# Patient Record
Sex: Female | Born: 1970 | Hispanic: Yes | Marital: Married | Smoking: Never smoker
Health system: Southern US, Community
[De-identification: ages and names within clinical notes are randomized; demographics above are authoritative.]

## PROBLEM LIST (undated history)

## (undated) DIAGNOSIS — O24419 Gestational diabetes mellitus in pregnancy, unspecified control: Secondary | ICD-10-CM

## (undated) DIAGNOSIS — Z789 Other specified health status: Secondary | ICD-10-CM

## (undated) HISTORY — PX: NO PAST SURGERIES: SHX2092

## (undated) HISTORY — DX: Gestational diabetes mellitus in pregnancy, unspecified control: O24.419

---

## 2015-07-23 LAB — CYTOLOGY - PAP: PAP SMEAR: ABNORMAL — AB

## 2016-06-13 LAB — OB RESULTS CONSOLE HEPATITIS B SURFACE ANTIGEN: Hepatitis B Surface Ag: NEGATIVE

## 2016-06-13 LAB — OB RESULTS CONSOLE ABO/RH: RH TYPE: POSITIVE

## 2016-06-13 LAB — OB RESULTS CONSOLE GC/CHLAMYDIA
CHLAMYDIA, DNA PROBE: NEGATIVE
Gonorrhea: NEGATIVE

## 2016-06-13 LAB — OB RESULTS CONSOLE PLATELET COUNT: Platelets: 212

## 2016-06-13 LAB — OB RESULTS CONSOLE HGB/HCT, BLOOD
HEMATOCRIT: 39
HEMOGLOBIN: 13.2

## 2016-06-13 LAB — OB RESULTS CONSOLE VARICELLA ZOSTER ANTIBODY, IGG: Varicella: IMMUNE

## 2016-06-13 LAB — SICKLE CELL SCREEN: SICKLE CELL SCREEN: NORMAL

## 2016-06-13 LAB — GLUCOSE TOLERANCE, 1 HOUR: Glucose, 1 Hour GTT: 185

## 2016-06-13 LAB — OB RESULTS CONSOLE RPR: RPR: NONREACTIVE

## 2016-06-13 LAB — OB RESULTS CONSOLE ANTIBODY SCREEN: Antibody Screen: NEGATIVE

## 2016-06-13 LAB — OB RESULTS CONSOLE HIV ANTIBODY (ROUTINE TESTING): HIV: NONREACTIVE

## 2016-06-13 LAB — OB RESULTS CONSOLE RUBELLA ANTIBODY, IGM: Rubella: IMMUNE

## 2016-06-16 ENCOUNTER — Other Ambulatory Visit (HOSPITAL_COMMUNITY): Payer: Self-pay | Admitting: Obstetrics and Gynecology

## 2016-06-16 ENCOUNTER — Encounter (HOSPITAL_COMMUNITY): Payer: Self-pay

## 2016-06-16 DIAGNOSIS — Z3682 Encounter for antenatal screening for nuchal translucency: Secondary | ICD-10-CM

## 2016-06-17 LAB — GLUCOSE TOLERANCE, 3 HOURS
Glucose, GTT - 1 Hour: 239 — AB (ref ?–200)
Glucose, GTT - Fasting: 116 mg/dL — AB (ref 80–110)

## 2016-06-21 ENCOUNTER — Ambulatory Visit (HOSPITAL_COMMUNITY): Payer: Self-pay

## 2016-06-21 ENCOUNTER — Ambulatory Visit (HOSPITAL_COMMUNITY)
Admission: RE | Admit: 2016-06-21 | Discharge: 2016-06-21 | Disposition: A | Discharge status: Home / Self Care | Payer: Self-pay | Source: Ambulatory Visit | Attending: Obstetrics and Gynecology | Admitting: Obstetrics and Gynecology

## 2016-07-01 ENCOUNTER — Encounter: Payer: Self-pay | Admitting: *Deleted

## 2016-07-19 ENCOUNTER — Encounter (HOSPITAL_COMMUNITY): Payer: Self-pay

## 2016-07-19 ENCOUNTER — Ambulatory Visit (HOSPITAL_COMMUNITY)
Admission: RE | Admit: 2016-07-19 | Discharge: 2016-07-19 | Disposition: A | Discharge status: Home / Self Care | Payer: Self-pay | Source: Ambulatory Visit | Attending: Obstetrics and Gynecology | Admitting: Obstetrics and Gynecology

## 2016-07-19 ENCOUNTER — Ambulatory Visit (HOSPITAL_COMMUNITY): Payer: Self-pay

## 2016-07-21 ENCOUNTER — Ambulatory Visit: Payer: Self-pay | Admitting: *Deleted

## 2016-07-21 DIAGNOSIS — O24410 Gestational diabetes mellitus in pregnancy, diet controlled: Secondary | ICD-10-CM

## 2016-07-21 NOTE — Progress Notes (Signed)
  Patient was seen on 07/21/2016 for Gestational Diabetes self-management .She is here with her husband and Spanish interpretor was used.  Patient states she had GDM with last pregnancy 8 years ago. She states she had a BG this pregnancy of 230 mg/dl when she was eating normally, which included several sweet beverages each day. She has stopped drinking those and the last test she states was within normal ranges. She does not have a meter yet. Diet history was taken. The following learning objectives were met by the patient :   States the definition of Gestational Diabetes  States why dietary management is important in controlling blood glucose  Describes the effects of carbohydrates on blood glucose levels  Demonstrates ability to create a balanced meal plan  Demonstrates carbohydrate counting   States when to check blood glucose levels  Demonstrates proper blood glucose monitoring techniques  States the effect of stress and exercise on blood glucose levels  States the importance of limiting caffeine and abstaining from alcohol and smoking  Plan:  Aim for 3 Carb Choices per meal (45 grams) +/- 1 either way  Aim for 1-2 Carbs per snack Begin reading food labels for Total Carbohydrate of foods Consider  increasing your activity level by walking or other activity daily as tolerated Begin checking BG before breakfast and 2 hours after first bite of breakfast, lunch and dinner as directed by MD  Bring Log Book to every medical appointment   Take medication if directed by MD  Blood glucose monitor given: [employer] Lot # [id number] Exp: 05/14/2018 Blood glucose reading: 89 mg/dl  Patient instructed to monitor glucose levels: FBS: 60 - 95 mg/dl 2 hour: <120 mg/dl  Patient received the following handouts:  Nutrition Diabetes and Pregnancy  Carbohydrate Counting List  Patient will be seen for follow-up as needed.

## 2016-07-22 ENCOUNTER — Ambulatory Visit (HOSPITAL_COMMUNITY)
Admission: RE | Admit: 2016-07-22 | Discharge: 2016-07-22 | Disposition: A | Discharge status: Home / Self Care | Payer: 59 | Source: Ambulatory Visit | Attending: Obstetrics and Gynecology | Admitting: Obstetrics and Gynecology

## 2016-07-22 ENCOUNTER — Encounter (HOSPITAL_COMMUNITY): Payer: Self-pay

## 2016-07-22 ENCOUNTER — Other Ambulatory Visit (HOSPITAL_COMMUNITY): Payer: Self-pay | Admitting: Maternal and Fetal Medicine

## 2016-07-22 ENCOUNTER — Ambulatory Visit (HOSPITAL_COMMUNITY): Payer: 59

## 2016-07-22 ENCOUNTER — Ambulatory Visit (HOSPITAL_COMMUNITY)
Admission: RE | Admit: 2016-07-22 | Discharge: 2016-07-22 | Disposition: A | Discharge status: Home / Self Care | Payer: 59 | Source: Ambulatory Visit | Attending: Maternal and Fetal Medicine | Admitting: Maternal and Fetal Medicine

## 2016-07-22 ENCOUNTER — Other Ambulatory Visit: Payer: Self-pay

## 2016-07-22 DIAGNOSIS — O24410 Gestational diabetes mellitus in pregnancy, diet controlled: Secondary | ICD-10-CM | POA: Insufficient documentation

## 2016-07-22 DIAGNOSIS — O24911 Unspecified diabetes mellitus in pregnancy, first trimester: Secondary | ICD-10-CM

## 2016-07-22 DIAGNOSIS — O09521 Supervision of elderly multigravida, first trimester: Secondary | ICD-10-CM

## 2016-07-22 DIAGNOSIS — Z3A15 15 weeks gestation of pregnancy: Secondary | ICD-10-CM | POA: Insufficient documentation

## 2016-07-22 DIAGNOSIS — O09522 Supervision of elderly multigravida, second trimester: Secondary | ICD-10-CM | POA: Diagnosis not present

## 2016-07-22 DIAGNOSIS — Z3A13 13 weeks gestation of pregnancy: Secondary | ICD-10-CM

## 2016-07-22 DIAGNOSIS — Z3687 Encounter for antenatal screening for uncertain dates: Secondary | ICD-10-CM

## 2016-07-22 HISTORY — DX: Other specified health status: Z78.9

## 2016-07-22 NOTE — Progress Notes (Signed)
Appointment Date: 07/22/16 DOB: Apr 18, 1970 Referring Provider: Caroll Rancher, MD Attending: Renella Cunas, MD  [name] was seen for genetic counseling because of a maternal age of 46 y.o..   Language resources in person interpreter provided Spanish/English translation.  In summary:  Discussed AMA and associated risk for fetal aneuploidy  Discussed options for screening - Panorama drawn today  First screen  Quad screen  NIPS  Ultrasound  Discussed diagnostic testing options - declined  [facility]  Amniocentesis  Reviewed family history concerns - none reported  Discussed carrier screening options - declined  CF  SMA  Hemoglobinopathies  She was counseled regarding maternal age and the association with risk for chromosome conditions due to nondisjunction with aging of the ova.   We reviewed chromosomes, nondisjunction, and the associated 1 in 22 risk for fetal aneuploidy related to a maternal age of 46 y.o. at [redacted]w[redacted]d gestation.  She was counseled that the risk for aneuploidy decreases as gestational age increases, accounting for those pregnancies which spontaneously abort.  We specifically discussed Down syndrome (trisomy 68), trisomies 60 and 76, and sex chromosome aneuploidies (47,XXX and 47,XXY) including the common features and prognoses of each.   We reviewed available screening options including First Screen, Quad screen, noninvasive prenatal screening (NIPS)/cell free DNA (cfDNA) screening, and detailed ultrasound.  She was counseled that screening tests are used to modify a patient's a priori risk for aneuploidy, typically based on age. This estimate provides a pregnancy specific risk assessment. We reviewed the benefits and limitations of each option. Specifically, we discussed the conditions for which each test screens, the detection rates, and false positive rates of each. She was also counseled regarding diagnostic testing via [facility] and amniocentesis. We  reviewed the approximate 1 in 382-505 risk for complications from amniocentesis, including spontaneous pregnancy loss. We discussed the possible results that the tests might provide including: positive, negative, unanticipated, and no result. Finally, she was counseled regarding the cost of each option and potential out of pocket expenses.   After consideration of all the options, she elected to proceed with NIPS.  Those results will be available in 8-10 days.    She also expressed interest in pursuing a first trimester ultrasound, which was performed today.  The report will be documented separately.    [name] was provided with written information regarding cystic fibrosis (CF), spinal muscular atrophy (SMA) and hemoglobinopathies including the carrier frequency, availability of carrier screening and prenatal diagnosis if indicated.  In addition, we discussed that CF and hemoglobinopathies are routinely screened for as part of the [place] newborn screening panel.  After further discussion, she declined screening for CF, SMA and hemoglobinopathies.  Both family histories were reviewed and found to be noncontributory for birth defects, intellectual disability, and known genetic conditions. Without further information regarding the provided family history, an accurate genetic risk cannot be calculated. Further genetic counseling is warranted if more information is obtained.  [name] denied exposure to environmental toxins or chemical agents. She denied the use of alcohol, tobacco or street drugs. She denied significant viral illnesses during the course of her pregnancy. Her medical and surgical histories were noncontributory.   I counseled [name] regarding the above risks and available options.  The approximate face-to-face time with the genetic counselor was 45 minutes.  Cam Hai, MS,  Certified Genetic Counselor

## 2016-07-25 ENCOUNTER — Ambulatory Visit (INDEPENDENT_AMBULATORY_CARE_PROVIDER_SITE_OTHER): Payer: 59 | Admitting: Obstetrics and Gynecology

## 2016-07-25 VITALS — BP 118/78 | HR 72 | Wt 181.5 lb

## 2016-07-25 DIAGNOSIS — O24419 Gestational diabetes mellitus in pregnancy, unspecified control: Secondary | ICD-10-CM | POA: Insufficient documentation

## 2016-07-25 DIAGNOSIS — O09522 Supervision of elderly multigravida, second trimester: Secondary | ICD-10-CM

## 2016-07-25 DIAGNOSIS — O0990 Supervision of high risk pregnancy, unspecified, unspecified trimester: Secondary | ICD-10-CM | POA: Insufficient documentation

## 2016-07-25 DIAGNOSIS — O0992 Supervision of high risk pregnancy, unspecified, second trimester: Secondary | ICD-10-CM

## 2016-07-25 DIAGNOSIS — O24410 Gestational diabetes mellitus in pregnancy, diet controlled: Secondary | ICD-10-CM

## 2016-07-25 NOTE — Progress Notes (Signed)
Subjective:  Anna Velez is a 46 y.o. [id number] at [redacted]w[redacted]d being seen today for ongoing prenatal care. She is transferred to the clinic from the [facility] d/t H/O GDM and failed early first trimester screening.  She is currently monitored for the following issues for this high-risk pregnancy and has Supervision of high risk pregnancy, antepartum; Advanced maternal age in multigravida, second trimester; and Gestational diabetes on her problem list.  Patient reports no complaints.  Contractions: Not present. Vag. Bleeding: None.  Movement: Present. Denies leaking of fluid.   The following portions of the patient's history were reviewed and updated as appropriate: allergies, current medications, past family history, past medical history, past social history, past surgical history and problem list. Problem list updated.  Objective:   Vitals:   07/25/16 1318  BP: 118/78  Pulse: 72  Weight: 181 lb 8 oz (82.3 kg)    Fetal Status: Fetal Heart Rate (bpm): 144   Movement: Present     General:  Alert, oriented and cooperative. Patient is in no acute distress.  Skin: Skin is warm and dry. No rash noted.   Cardiovascular: Normal heart rate noted  Respiratory: Normal respiratory effort, no problems with respiration noted  Abdomen: Soft, gravid, appropriate for gestational age. Pain/Pressure: Absent     Pelvic:  Cervical exam deferred        Extremities: Normal range of motion.  Edema: None  Mental Status: Normal mood and affect. Normal behavior. Normal judgment and thought content.   Urinalysis:      Assessment and Plan:  Pregnancy: [id number] at [redacted]w[redacted]d  1. Advanced maternal age in multigravida, second trimester Had genetic screening drawn last week - [place] MFM OB COMP + 89 WK; Future  2. Supervision of high risk pregnancy, antepartum Stable Continue with qdPNV  3. Diet controlled gestational diabetes mellitus (GDM) in second trimester BS in goal range for the most part, outliners related to diet  choices. Suspect pt is truly a Type 2 DM but will wait for A1C Pregnancy and DM reviewed with pt - TSH - Hemoglobin A1c Pt traveling to [place] for 2 weeks. DVT   reviewed with pt Preterm labor symptoms and general obstetric precautions including but not limited to vaginal bleeding, contractions, leaking of fluid and fetal movement were reviewed in detail with the patient. Please refer to After Visit Summary for other counseling recommendations.  No Follow-up on file.   Chancy Milroy, MD

## 2016-07-25 NOTE — Patient Instructions (Signed)
[place] compresin (How to Use Compression Stockings) [place] compresin son medias elsticas que aprietan las piernas. Ayudan a aumentar la circulacin de sangre a las piernas, reducen su hinchazn y [employer] probabilidad de que se formen cogulos de [employer]. Generalmente, las siguientes personas usan medias de compresin:  Las que se estn recuperando de [place].  Las que tienen mala circulacin en las piernas.  Las que son propensas a Best boy cogulos de [employer] piernas.  Las que tienen venas varicosas.  Las que [employer] sentadas o en cama durante mucho tiempo. CMO USAR LAS MEDIAS DE COMPRESIN Antes de colocarse las medias de compresin:  Verifique que sean de la talla correcta. Si no sabe cul es la talla, pregntele al mdico.  Asegrese de que estn limpias, secas y en buenas condiciones.  Revselas para ver si estn rasgadas o rotas. No se las ponga si estn rasgadas o rotas. Pngase las medias en cuanto se despierta por la maana, antes de levantarse. Tngalas puestas durante el tiempo que el mdico le aconseje. Cuando use las medias:  Mantngalas tan estiradas como sea posible. No permita que se arruguen. Es Therapist, occupational que las medias se arruguen alrededor de los dedos de los pies o detrs de las rodillas.  No se baje las medias ni las deje bajas. Esto puede reducir el flujo de sangre a las piernas.  Cmbieselas de inmediato si se mojan o se ensucian. Cuando se quite las medias, [employer] piernas y los pies. Cualquier cosa que no parezca normal puede requerir [employer]. Observe si tiene:  Llagas abiertas.  Puntos rojos.  Hinchazn. INFORMACIN Y CONSEJOS  No deje de [employer] de compresin sin hablar antes con el [place] medias con un detergente suave y agua fra o tibia. No use leja. Deje secar las medias al aire o squelas en una secadora de ropa a baja temperatura.  Reemplace las  medias cada 3 a 77meses.  Si le han indicado que use un humectante para la piel como parte de su plan de tratamiento, aplique la crema o la locin por la noche, de modo que la piel est seca cuando se coloque las medias por la [place]. Es ms difcil ponerse las medias con locin en las piernas o los pies. SOLICITE ATENCIN MDICA SI: [place] y solicite atencin mdica si:  Tiene sensacin de pinchazos en los pies o en las piernas.  Tiene algn cambio nuevo en la piel.  Tiene lesiones cutneas que empeoran.  Tiene hinchazn o dolor que empeoran. SOLICITE ATENCIN MDICA DE INMEDIATO SI:  Tiene entumecimiento u hormigueo en las piernas que no mejoran inmediatamente despus de [employer].  Los dedos de los pies o los pies estn fros y de [employer].  Le aparecen llagas abiertas o puntos rojos en las piernas que no desaparecen.  Observa o percibe un punto caliente en la pierna.  Tiene una hinchazn o un dolor nuevos en la pierna.  Siente dolor en el pecho o le falta el aire sin ningn motivo.  Tiene latidos cardacos rpidos o irregulares.  Se siente mareado o siente que va a desvanecerse. Esta informacin no tiene Marine scientist el consejo del mdico. Asegrese de hacerle al mdico cualquier pregunta que tenga. Document Released: 10/05/2011 Document Revised: 05/04/2015 Document Reviewed: 12/18/2013 Elsevier Interactive Patient Education  2018 [place] de diabetes mellitus gestacional (Gestational Diabetes Mellitus, Diagnosis) La diabetes gestacional (diabetes mellitus  gestacional) es una forma de diabetes a corto plazo (temporal) que puede Copy. Este cuadro desaparece despus del parto. Puede deberse a uno de [employer] o a ambos:  El cuerpo no produce la cantidad suficiente de una hormona llamada insulina.  El cuerpo no responde de forma normal a la insulina que produce. La insulina permite que los ciertos  azcares (glucosa) ingresen a las clulas del cuerpo. Esto le proporciona la energa. Cuando se tiene diabetes, la glucosa no puede ingresar a las clulas. Esto produce un aumento del nivel de glucosa en la sangre (hiperglucemia). Si la diabetes se trata, es posible que ni usted ni el beb se vean afectados. El mdico fijar los objetivos del tratamiento para usted. Generalmente, los resultados de la glucemia deben ser los siguientes:  Despus de no comer durante mucho tiempo (ayunar): 95mg /dl (5,16mmol/l).  Despus de las comidas (posprandial): ? Una hora despus de una comida: igual o menor que 140mg /dl (7,65mmol/l). ? Dos horas despus de una comida: igual o menor que 120mg /dl (6,63mmol/l).  Nivel de A1c (hemoglobinaA1c): del 6% al 6,5%. CUIDADOS EN EL HOGAR Preguntas para hacerle al mdico Puede hacer las siguientes preguntas:  Debo reunirme con Radio broadcast assistant para el cuidado de la diabetes?  Dnde puedo encontrar un grupo de apoyo para personas diabticas?  Qu equipos necesitar para cuidarme en casa?  Qu medicamentos para la diabetes necesito? Cundo debo tomarlos?  Con qu frecuencia debo controlarme el nivel de glucosa en la sangre?  A qu nmero puedo llamar si tengo preguntas?  Cundo es la prxima cita con el mdico? Instrucciones generales  [employer] medicamentos de venta libre y los recetados solamente como se lo haya indicado el mdico.  Mantenga un peso saludable durante el [place].  Concurra a todas las visitas de control como se lo haya indicado el mdico. Esto es importante. SOLICITE AYUDA SI:  Su nivel de glucosa en la sangre es igual o superior a 240mg /dl (13,28mmol/dl).  Su nivel de glucosa en la sangre es igual o superior a 200mg /dl (11,41mmol/l), y tiene cetonas en la orina.  Ha estado enferma o ha tenido fiebre durante 2o ms das y no [place].  Si tiene alguno de estos problemas durante ms de 6horas: ? No puede comer ni  beber. ? Siente malestar estomacal (nuseas). ? Vomita. ? La materia fecal es lquida (diarrea). SOLICITE AYUDA DE INMEDIATO SI:  El nivel de glucosa en la sangre est por debajo de 54mg /dl (17mmol/l).  Est confundida.  Tiene dificultad para hacer lo siguiente: ? Pensar con claridad. ? Respirar.  El beb se mueve menos de lo normal.  Tiene los siguientes sntomas: ? Niveles moderados o altos de cetonas en la orina. ? Hemorragia vaginal. ? Secrecin de un lquido fuera de lo comn de la vagina. ? Contracciones prematuras. Estas pueden causar una sensacin de opresin en el vientre. Esta informacin no tiene Marine scientist el consejo del mdico. Asegrese de hacerle al mdico cualquier pregunta que tenga. Document Released: 05/04/2015 Document Revised: 05/04/2015 Document Reviewed: 02/13/2015 Elsevier Interactive Patient Education  [employer].

## 2016-07-26 LAB — TSH: TSH: 1.38 u[IU]/mL (ref 0.450–4.500)

## 2016-07-26 LAB — HEMOGLOBIN A1C
Est. average glucose Bld gHb Est-mCnc: 126 mg/dL
Hgb A1c MFr Bld: 6 % — ABNORMAL HIGH (ref 4.8–5.6)

## 2016-07-28 ENCOUNTER — Other Ambulatory Visit: Payer: Self-pay

## 2016-07-29 ENCOUNTER — Telehealth (HOSPITAL_COMMUNITY): Payer: Self-pay | Admitting: MS"

## 2016-07-29 NOTE — Telephone Encounter (Signed)
Called Dennard Nip to discuss her prenatal cell free DNA test results with [facility] interpreter, Eda.  Mrs. Anna Velez had Panorama testing through [facility].  Testing was offered because of maternal age.   The patient was identified by name and DOB.  We reviewed that these are within normal limits, showing a less than 1 in 10,000 risk for trisomies 21, 18 and 13, and monosomy X (Turner syndrome).  In addition, the risk for triploidy and sex chromosome trisomies (47,XXX and 47,XXY) was also low risk. We reviewed that this testing identifies > 99% of pregnancies with trisomy 80, trisomy 62, sex chromosome trisomies (47,XXX and 47,XXY), and triploidy. The detection rate for trisomy 18 is 96%.  The detection rate for monosomy X is ~92%.  The false positive rate is <0.1% for all conditions. Testing was also consistent with female fetal sex.  The patient did wish to know fetal sex.  She understands that this testing does not identify all genetic conditions.  All questions were answered to her satisfaction, she was encouraged to call with additional questions or concerns.  Chipper Oman, MS Certified Genetic Counselor 07/29/2016 1:37 PM

## 2016-07-29 NOTE — Telephone Encounter (Signed)
Attempted to contact the patient regarding results of noninvasive prenatal screening (NIPS)/prenatal cell free DNA testing, which are within normal limits. Called patient and spoke with patient's husband through telephonic interpreter [phone]. Husband stated that he is at work until 1:00 pm today with the cell phone and will ask patient to return call once he returns home this afternoon.   Santiago Glad Lannie Heaps 07/29/2016 10:05 AM

## 2016-08-08 ENCOUNTER — Encounter: Payer: Self-pay | Admitting: General Practice

## 2016-08-08 DIAGNOSIS — O0990 Supervision of high risk pregnancy, unspecified, unspecified trimester: Secondary | ICD-10-CM

## 2016-08-15 ENCOUNTER — Ambulatory Visit (HOSPITAL_COMMUNITY): Admission: RE | Admit: 2016-08-15 | Payer: 59 | Source: Ambulatory Visit

## 2016-08-24 ENCOUNTER — Telehealth: Payer: Self-pay | Admitting: Family Medicine

## 2016-08-24 ENCOUNTER — Encounter: Payer: Self-pay | Admitting: Family Medicine

## 2016-08-24 ENCOUNTER — Encounter: Payer: 59 | Admitting: Obstetrics and Gynecology

## 2016-09-05 ENCOUNTER — Ambulatory Visit: Payer: 59 | Admitting: Dietician

## 2016-09-08 ENCOUNTER — Encounter: Payer: 59 | Attending: Obstetrics and Gynecology | Admitting: Dietician

## 2016-09-08 ENCOUNTER — Encounter: Payer: Self-pay | Admitting: Dietician

## 2016-09-08 DIAGNOSIS — O24410 Gestational diabetes mellitus in pregnancy, diet controlled: Secondary | ICD-10-CM | POA: Diagnosis not present

## 2016-09-08 DIAGNOSIS — Z713 Dietary counseling and surveillance: Secondary | ICD-10-CM | POA: Insufficient documentation

## 2016-09-08 NOTE — Progress Notes (Signed)
Diabetes Self-Management Education  Visit Type: First/Initial  Appt. Start Time: 0830 (patient arrived late) Appt. End Time: 1000  09/08/2016  [name], identified by name and date of birth, is a 46 y.o. female with a diagnosis of Diabetes: Gestational Diabetes.  There are concerns that she had preexisting diabetes or prediabetes.  Her A1C was 6% 07/25/16.  She had Gestational diabetes with her last pregnancy.  Patient lives with her husband, 68 yo and 65 yo.  She has a 60 yo child in [place].  She does the shopping and cooking.    She has a blood sugar meter but did not bring it and does not recall the name.   A new meter provided for demonstration: [employer]  Lot:  [id number], Expiration 08/23/17  They did not want an interpretor.  Appropriate forms signed.  She speaks a small amount of [place].  Her husband is proficient in [place].  He provided interpretation.  ASSESSMENT  Height 5' 2.99" (1.6 m), weight 181 lb (82.1 kg), last menstrual period 04/17/2016. Body mass index is 32.07 kg/m.      Diabetes Self-Management Education - 09/08/16 0852      Visit Information   Visit Type First/Initial     Initial Visit   Diabetes Type Gestational Diabetes   Are you currently following a meal plan? No   Are you taking your medications as prescribed? Not on Medications   Date Diagnosed 05-2016     Health Coping   How would you rate your overall health? Excellent     Psychosocial Assessment   Self-care barriers English as a second language   Self-management support Doctor's office;Family   Other persons present Patient;Spouse/SO   Patient Concerns Nutrition/Meal planning;Glycemic Control   Special Needs Other (comment)  interpretor and Spanish materials   Preferred Learning Style No preference indicated   Learning Readiness Ready   How often do you need to have someone help you when you read instructions, pamphlets, or other written materials from your doctor or  pharmacy? 1 - Never  When in spanish   What is the last grade level you completed in school? 10th grade in [place]     Pre-Education Assessment   Patient understands the diabetes disease and treatment process. Needs Instruction   Patient understands incorporating nutritional management into lifestyle. Needs Instruction   Patient undertands incorporating physical activity into lifestyle. Needs Instruction   Patient understands monitoring blood glucose, interpreting and using results Needs Instruction   Patient understands prevention, detection, and treatment of acute complications. Needs Instruction   Patient understands prevention, detection, and treatment of chronic complications. Needs Instruction   Patient understands how to develop strategies to address psychosocial issues. Needs Instruction   Patient understands how to develop strategies to promote health/change behavior. Needs Instruction     Complications   Last HgB A1C per patient/outside source 6 %  07/25/16   How often do you check your blood sugar? 3-4 times/day   Fasting Blood glucose range (mg/dL) 70-129   Postprandial Blood glucose range (mg/dL) 70-129     Dietary Intake   Breakfast eggs, [employer] toast, cheese, juice  8 am   Snack (morning) none   Lunch salad, 2 tortillas, beans, chicken OR McDonald's grilled chicken salad  1 pm   Snack (afternoon) cereal (unsweetened) and milk OR cucumber   Dinner Oatmeal and eggs   7 pm   Snack (evening) none   Beverage(s) water, juice     Exercise  Exercise Type Light (walking / raking leaves)   How many days per week to you exercise? 0   How many minutes per day do you exercise? 0   Total minutes per week of exercise 0     Patient Education   Previous Diabetes Education No   Disease state  Other (comment)  GDM   Nutrition management  Role of diet in the treatment of diabetes and the relationship between the three main macronutrients and blood glucose level;Food label reading,  portion sizes and measuring food.;Carbohydrate counting;Meal options for control of blood glucose level and chronic complications.   Physical activity and exercise  Role of exercise on diabetes management, blood pressure control and cardiac health.   Monitoring Taught/evaluated SMBG meter.;Purpose and frequency of SMBG.;Identified appropriate SMBG and/or A1C goals.   Acute complications Taught treatment of hypoglycemia - the 15 rule.   Psychosocial adjustment Role of stress on diabetes;Identified and addressed patients feelings and concerns about diabetes   Preconception care Pregnancy and GDM  Role of pre-pregnancy blood glucose control on the development of the fetus     Individualized Goals (developed by patient)   Nutrition Follow meal plan discussed   Physical Activity Exercise 5-7 days per week;30 minutes per day   Medications Not Applicable   Monitoring  test my blood glucose as discussed   Health Coping discuss diabetes with (comment)  MD/RD     Post-Education Assessment   Patient understands the diabetes disease and treatment process. Demonstrates understanding / competency   Patient understands incorporating nutritional management into lifestyle. Demonstrates understanding / competency   Patient undertands incorporating physical activity into lifestyle. Demonstrates understanding / competency   Patient understands monitoring blood glucose, interpreting and using results Demonstrates understanding / competency   Patient understands prevention, detection, and treatment of acute complications. Demonstrates understanding / competency   Patient understands prevention, detection, and treatment of chronic complications. Demonstrates understanding / competency   Patient understands how to develop strategies to address psychosocial issues. Demonstrates understanding / competency   Patient understands how to develop strategies to promote health/change behavior. Demonstrates understanding /  competency     Outcomes   Expected Outcomes Demonstrated interest in learning. Expect positive outcomes   Future DMSE PRN   Program Status Completed      Individualized Plan for Diabetes Self-Management Training:   Learning Objective:  Patient will have a greater understanding of diabetes self-management. Patient education plan is to attend individual and/or group sessions per assessed needs and concerns.   Plan:   Patient Instructions  Continue to check your blood sugar 4 times per day.  Before breakfast  2 hours after breakfast  2 hours after lunch  2 hours after dinner Record your readings and bring them to your doctor. Avoid juice and added sugar. Continue to bake or grill rather than fry. Continue to eat increased non starchy vegetables. Have protein and carbohydrate with each meal and snack.   Expected Outcomes:  Demonstrated interest in learning. Expect positive outcomes  Education material provided: Meal plan card, Planning Helathy meals, and Nutrition, Diabetes, and Pregnancy all in Spanish.  If problems or questions, patient to contact team via:  Phone  Future DSME appointment: PRN

## 2016-09-08 NOTE — Patient Instructions (Signed)
Continue to check your blood sugar 4 times per day.  Before breakfast  2 hours after breakfast  2 hours after lunch  2 hours after dinner Record your readings and bring them to your doctor. Avoid juice and added sugar. Continue to bake or grill rather than fry. Continue to eat increased non starchy vegetables. Have protein and carbohydrate with each meal and snack.

## 2016-09-20 ENCOUNTER — Ambulatory Visit (INDEPENDENT_AMBULATORY_CARE_PROVIDER_SITE_OTHER): Payer: 59 | Admitting: Obstetrics and Gynecology

## 2016-09-20 ENCOUNTER — Other Ambulatory Visit: Payer: Self-pay

## 2016-09-20 VITALS — BP 113/62 | HR 67 | Wt 183.0 lb

## 2016-09-20 DIAGNOSIS — O09522 Supervision of elderly multigravida, second trimester: Secondary | ICD-10-CM

## 2016-09-20 DIAGNOSIS — Z789 Other specified health status: Secondary | ICD-10-CM

## 2016-09-20 DIAGNOSIS — Z603 Acculturation difficulty: Secondary | ICD-10-CM | POA: Insufficient documentation

## 2016-09-20 DIAGNOSIS — Z713 Dietary counseling and surveillance: Secondary | ICD-10-CM | POA: Diagnosis not present

## 2016-09-20 DIAGNOSIS — O0990 Supervision of high risk pregnancy, unspecified, unspecified trimester: Secondary | ICD-10-CM

## 2016-09-20 DIAGNOSIS — O24419 Gestational diabetes mellitus in pregnancy, unspecified control: Secondary | ICD-10-CM

## 2016-09-20 MED ORDER — ASPIRIN EC 81 MG PO TBEC: 81.0000 mg | DELAYED_RELEASE_TABLET | Freq: Every day | ORAL | 10 refills | Status: DC

## 2016-09-20 NOTE — Progress Notes (Signed)
Prenatal Visit Note Date: 09/20/2016 Clinic: [facility]  Subjective:  Anna Velez is a 46 y.o. [id number] at [redacted]w[redacted]d being seen today for ongoing prenatal care.  She is currently monitored for the following issues for this high-risk pregnancy and has Supervision of high risk pregnancy, antepartum; Advanced maternal age in multigravida, second trimester; Gestational diabetes (early diagnosis); and Language barrier on her problem list.  Patient reports no complaints.   Contractions: Not present. Vag. Bleeding: None.  Movement: Present. Denies leaking of fluid.   The following portions of the patient's history were reviewed and updated as appropriate: allergies, current medications, past family history, past medical history, past social history, past surgical history and problem list. Problem list updated.  Objective:   Vitals:   09/20/16 1426  BP: 113/62  Pulse: 67  Weight: 183 lb (83 kg)    Fetal Status: Fetal Heart Rate (bpm): 156 Fundal Height: 23 cm Movement: Present     General:  Alert, oriented and cooperative. Patient is in no acute distress.  Skin: Skin is warm and dry. No rash noted.   Cardiovascular: Normal heart rate noted  Respiratory: Normal respiratory effort, no problems with respiration noted  Abdomen: Soft, gravid, appropriate for gestational age. Pain/Pressure: Absent     Pelvic:  Cervical exam deferred        Extremities: Normal range of motion.  Edema: None  Mental Status: Normal mood and affect. Normal behavior. Normal judgment and thought content.   Urinalysis:      Assessment and Plan:  Pregnancy: [id number] at [redacted]w[redacted]d  1. Gestational diabetes mellitus (GDM) in second trimester, gestational diabetes method of control unspecified Normal log book today. Pt to be set up for fetal echo given AMA and early GDM diagnosis. Recommend start low dose ASA today. Baseline pre-x labs - Comprehensive metabolic panel - HgB Q9I - Protein / creatinine  ratio, urine - EKG 12-Lead - Ambulatory referral to Pediatric Cardiology  2. Language barrier Interpreter used  3. Supervision of high risk pregnancy, antepartum Pt had u/s in [place] and brought disc and report (in spanish). Given high risk pregnancy, I told her that I recommend MFM u/s but to bring report and disc with her tomorrow to anatomy u/s. Baseline EKG today - Comprehensive metabolic panel - HgB Y6E - Protein / creatinine ratio, urine - EKG 12-Lead - Ambulatory referral to Pediatric Cardiology  4. Advanced maternal age in multigravida, second trimester Low risk panorama. Seen by [facility] already  Preterm labor symptoms and general obstetric precautions including but not limited to vaginal bleeding, contractions, leaking of fluid and fetal movement were reviewed in detail with the patient. Please refer to After Visit Summary for other counseling recommendations.  Return in about 2 weeks (around 10/04/2016).   Aletha Halim, MD

## 2016-09-21 ENCOUNTER — Ambulatory Visit (HOSPITAL_COMMUNITY): Payer: Self-pay

## 2016-09-21 LAB — COMPREHENSIVE METABOLIC PANEL
ALK PHOS: 55 IU/L (ref 39–117)
ALT: 8 IU/L (ref 0–32)
AST: 9 IU/L (ref 0–40)
Albumin/Globulin Ratio: 1.4 (ref 1.2–2.2)
Albumin: 3.9 g/dL (ref 3.5–5.5)
BILIRUBIN TOTAL: 0.4 mg/dL (ref 0.0–1.2)
BUN / CREAT RATIO: 24 — AB (ref 9–23)
BUN: 9 mg/dL (ref 6–24)
CHLORIDE: 99 mmol/L (ref 96–106)
CO2: 21 mmol/L (ref 20–29)
CREATININE: 0.38 mg/dL — AB (ref 0.57–1.00)
Calcium: 9.3 mg/dL (ref 8.7–10.2)
GFR calc Af Amer: 150 mL/min/{1.73_m2} (ref >59)
GFR calc non Af Amer: 130 mL/min/{1.73_m2} (ref >59)
GLOBULIN, TOTAL: 2.8 g/dL (ref 1.5–4.5)
Glucose: 77 mg/dL (ref 65–99)
POTASSIUM: 4.1 mmol/L (ref 3.5–5.2)
SODIUM: 134 mmol/L (ref 134–144)
Total Protein: 6.7 g/dL (ref 6.0–8.5)

## 2016-09-21 LAB — HEMOGLOBIN A1C
Est. average glucose Bld gHb Est-mCnc: 117 mg/dL
HEMOGLOBIN A1C: 5.7 % — AB (ref 4.8–5.6)

## 2016-09-21 LAB — PROTEIN / CREATININE RATIO, URINE
Creatinine, Urine: 69.7 mg/dL
PROTEIN/CREAT RATIO: 162 mg/g{creat} (ref 0–200)
Protein, Ur: 11.3 mg/dL

## 2016-09-22 ENCOUNTER — Encounter (HOSPITAL_COMMUNITY): Payer: Self-pay

## 2016-09-22 ENCOUNTER — Other Ambulatory Visit (HOSPITAL_COMMUNITY): Payer: Self-pay | Admitting: *Deleted

## 2016-09-22 ENCOUNTER — Other Ambulatory Visit: Payer: Self-pay | Admitting: Obstetrics and Gynecology

## 2016-09-22 ENCOUNTER — Ambulatory Visit (HOSPITAL_COMMUNITY)
Admission: RE | Admit: 2016-09-22 | Discharge: 2016-09-22 | Disposition: A | Discharge status: Home / Self Care | Payer: 59 | Source: Ambulatory Visit | Attending: Obstetrics and Gynecology | Admitting: Obstetrics and Gynecology

## 2016-09-22 DIAGNOSIS — O09529 Supervision of elderly multigravida, unspecified trimester: Secondary | ICD-10-CM

## 2016-09-22 DIAGNOSIS — Z3A24 24 weeks gestation of pregnancy: Secondary | ICD-10-CM | POA: Diagnosis present

## 2016-09-22 DIAGNOSIS — O402XX0 Polyhydramnios, second trimester, not applicable or unspecified: Secondary | ICD-10-CM | POA: Diagnosis not present

## 2016-09-22 DIAGNOSIS — Z363 Encounter for antenatal screening for malformations: Secondary | ICD-10-CM | POA: Diagnosis not present

## 2016-09-22 DIAGNOSIS — O24410 Gestational diabetes mellitus in pregnancy, diet controlled: Secondary | ICD-10-CM | POA: Insufficient documentation

## 2016-09-22 DIAGNOSIS — O3412 Maternal care for benign tumor of corpus uteri, second trimester: Secondary | ICD-10-CM | POA: Diagnosis not present

## 2016-09-22 DIAGNOSIS — O09522 Supervision of elderly multigravida, second trimester: Secondary | ICD-10-CM | POA: Diagnosis present

## 2016-09-22 DIAGNOSIS — O24419 Gestational diabetes mellitus in pregnancy, unspecified control: Secondary | ICD-10-CM

## 2016-09-22 DIAGNOSIS — D259 Leiomyoma of uterus, unspecified: Secondary | ICD-10-CM | POA: Diagnosis not present

## 2016-09-22 HISTORY — DX: Gestational diabetes mellitus in pregnancy, unspecified control: O24.419

## 2016-09-23 ENCOUNTER — Encounter: Payer: Self-pay | Admitting: Obstetrics and Gynecology

## 2016-09-23 DIAGNOSIS — O409XX0 Polyhydramnios, unspecified trimester, not applicable or unspecified: Secondary | ICD-10-CM | POA: Insufficient documentation

## 2016-10-06 ENCOUNTER — Other Ambulatory Visit: Payer: Self-pay

## 2016-10-13 ENCOUNTER — Ambulatory Visit (INDEPENDENT_AMBULATORY_CARE_PROVIDER_SITE_OTHER): Payer: Medicaid Other | Admitting: Obstetrics & Gynecology

## 2016-10-13 VITALS — BP 113/53 | HR 89 | Wt 183.7 lb

## 2016-10-13 DIAGNOSIS — O0992 Supervision of high risk pregnancy, unspecified, second trimester: Secondary | ICD-10-CM | POA: Diagnosis not present

## 2016-10-13 DIAGNOSIS — O09522 Supervision of elderly multigravida, second trimester: Secondary | ICD-10-CM | POA: Diagnosis not present

## 2016-10-13 DIAGNOSIS — Z23 Encounter for immunization: Secondary | ICD-10-CM

## 2016-10-13 DIAGNOSIS — O409XX0 Polyhydramnios, unspecified trimester, not applicable or unspecified: Secondary | ICD-10-CM

## 2016-10-13 DIAGNOSIS — O24410 Gestational diabetes mellitus in pregnancy, diet controlled: Secondary | ICD-10-CM

## 2016-10-13 DIAGNOSIS — R200 Anesthesia of skin: Secondary | ICD-10-CM | POA: Diagnosis not present

## 2016-10-13 DIAGNOSIS — O0990 Supervision of high risk pregnancy, unspecified, unspecified trimester: Secondary | ICD-10-CM

## 2016-10-13 LAB — POCT URINALYSIS DIP (DEVICE)
BILIRUBIN URINE: NEGATIVE
GLUCOSE, UA: NEGATIVE mg/dL
KETONES UR: 80 mg/dL — AB
Leukocytes, UA: NEGATIVE
Nitrite: NEGATIVE
PROTEIN: NEGATIVE mg/dL
SPECIFIC GRAVITY, URINE: 1.025 (ref 1.005–1.030)
Urobilinogen, UA: 0.2 mg/dL (ref 0.0–1.0)
pH: 6.5 (ref 5.0–8.0)

## 2016-10-13 MED ORDER — ACCU-CHEK GUIDE W/DEVICE KIT: 1.0000 | PACK | Freq: Once | 0 refills | Status: AC

## 2016-10-13 MED ORDER — GLUCOSE BLOOD VI STRP: ORAL_STRIP | 12 refills | Status: AC

## 2016-10-13 MED ORDER — ACCU-CHEK FASTCLIX LANCETS MISC: 1.0000 | Freq: Four times a day (QID) | 11 refills | Status: AC

## 2016-10-13 NOTE — Progress Notes (Signed)
   PRENATAL VISIT NOTE  Subjective:  Anna Velez is a 46 y.o. [phone] at [redacted]w[redacted]d being seen today for ongoing prenatal care.  She is currently monitored for the following issues for this high-risk pregnancy and has Supervision of high risk pregnancy, antepartum; Advanced maternal age in multigravida, second trimester; Gestational diabetes (early diagnosis); Language barrier; and Polyhydramnios affecting pregnancy on her problem list.  Patient reports no complaints.  Contractions: Not present. Vag. Bleeding: Small.  Movement: Present. Denies leaking of fluid.   The following portions of the patient's history were reviewed and updated as appropriate: allergies, current medications, past family history, past medical history, past social history, past surgical history and problem list. Problem list updated.  Objective:   Vitals:   10/13/16 1032  BP: (!) 113/53  Pulse: 89  Weight: 183 lb 11.2 oz (83.3 kg)    Fetal Status:     Movement: Present     General:  Alert, oriented and cooperative. Patient is in no acute distress.  Skin: Skin is warm and dry. No rash noted.   Cardiovascular: Normal heart rate noted  Respiratory: Normal respiratory effort, no problems with respiration noted  Abdomen: Soft, gravid, appropriate for gestational age.  Pain/Pressure: Present     Pelvic: Cervical exam deferred        Extremities: Normal range of motion.  Edema: None  Mental Status:  Normal mood and affect. Normal behavior. Normal judgment and thought content.   Assessment and Plan:  Pregnancy: [id number] at [redacted]w[redacted]d  1. Supervision of high risk pregnancy, antepartum - CBC - HIV antibody (with reflex) - RPR - Tdap vaccine greater than or equal to 7yo IM - Flu Vaccine QUAD 36+ mos IM (Fluarix, Quad PF)  2. Polyhydramnios affecting pregnancy F/u US at [place]  3. Advanced maternal age in multigravida, second trimester Testing per antenatal  4. Diet controlled gestational diabetes mellitus (GDM) in  third trimester -Patient did not bring CBG log.  States she is in good control.    5. Left leg numbness -outer leg numbness and pain near hip. No trauma.   - AMB referral to sports medicine  Preterm labor symptoms and general obstetric precautions including but not limited to vaginal bleeding, contractions, leaking of fluid and fetal movement were reviewed in detail with the patient. Please refer to After Visit Summary for other counseling recommendations.  Return in about 2 weeks (around 10/27/2016).   Silas Sacramento, MD

## 2016-10-14 LAB — CBC
Hematocrit: 33.5 % — ABNORMAL LOW (ref 34.0–46.6)
Hemoglobin: 11.4 g/dL (ref 11.1–15.9)
MCH: 31.8 pg (ref 26.6–33.0)
MCHC: 34 g/dL (ref 31.5–35.7)
MCV: 94 fL (ref 79–97)
PLATELETS: 209 10*3/uL (ref 150–379)
RBC: 3.58 x10E6/uL — ABNORMAL LOW (ref 3.77–5.28)
RDW: 13.3 % (ref 12.3–15.4)
WBC: 6.6 10*3/uL (ref 3.4–10.8)

## 2016-10-14 LAB — HIV ANTIBODY (ROUTINE TESTING W REFLEX): HIV Screen 4th Generation wRfx: NONREACTIVE

## 2016-10-14 LAB — RPR: RPR Ser Ql: NONREACTIVE

## 2016-10-21 ENCOUNTER — Other Ambulatory Visit (HOSPITAL_COMMUNITY): Payer: Self-pay | Admitting: Maternal and Fetal Medicine

## 2016-10-21 ENCOUNTER — Encounter (HOSPITAL_COMMUNITY): Payer: Self-pay

## 2016-10-21 ENCOUNTER — Ambulatory Visit (HOSPITAL_COMMUNITY)
Admission: RE | Admit: 2016-10-21 | Discharge: 2016-10-21 | Disposition: A | Discharge status: Home / Self Care | Payer: Medicaid Other | Source: Ambulatory Visit | Attending: Obstetrics & Gynecology | Admitting: Obstetrics & Gynecology

## 2016-10-21 DIAGNOSIS — O24410 Gestational diabetes mellitus in pregnancy, diet controlled: Secondary | ICD-10-CM

## 2016-10-21 DIAGNOSIS — O09529 Supervision of elderly multigravida, unspecified trimester: Secondary | ICD-10-CM

## 2016-10-21 DIAGNOSIS — Z3A28 28 weeks gestation of pregnancy: Secondary | ICD-10-CM

## 2016-10-21 DIAGNOSIS — O09523 Supervision of elderly multigravida, third trimester: Secondary | ICD-10-CM | POA: Insufficient documentation

## 2016-10-21 DIAGNOSIS — O403XX0 Polyhydramnios, third trimester, not applicable or unspecified: Secondary | ICD-10-CM | POA: Diagnosis not present

## 2016-10-21 DIAGNOSIS — Z362 Encounter for other antenatal screening follow-up: Secondary | ICD-10-CM | POA: Insufficient documentation

## 2016-10-26 ENCOUNTER — Ambulatory Visit: Payer: Medicaid Other | Admitting: Family Medicine

## 2016-10-28 ENCOUNTER — Ambulatory Visit (INDEPENDENT_AMBULATORY_CARE_PROVIDER_SITE_OTHER): Payer: Medicaid Other | Admitting: Obstetrics and Gynecology

## 2016-10-28 VITALS — BP 112/57 | HR 74 | Wt 184.8 lb

## 2016-10-28 DIAGNOSIS — O09523 Supervision of elderly multigravida, third trimester: Secondary | ICD-10-CM

## 2016-10-28 DIAGNOSIS — R87610 Atypical squamous cells of undetermined significance on cytologic smear of cervix (ASC-US): Secondary | ICD-10-CM

## 2016-10-28 DIAGNOSIS — Z789 Other specified health status: Secondary | ICD-10-CM

## 2016-10-28 DIAGNOSIS — O0990 Supervision of high risk pregnancy, unspecified, unspecified trimester: Secondary | ICD-10-CM

## 2016-10-28 DIAGNOSIS — O0993 Supervision of high risk pregnancy, unspecified, third trimester: Secondary | ICD-10-CM

## 2016-10-28 DIAGNOSIS — O09522 Supervision of elderly multigravida, second trimester: Secondary | ICD-10-CM

## 2016-10-28 DIAGNOSIS — O24410 Gestational diabetes mellitus in pregnancy, diet controlled: Secondary | ICD-10-CM

## 2016-10-28 DIAGNOSIS — O409XX0 Polyhydramnios, unspecified trimester, not applicable or unspecified: Secondary | ICD-10-CM

## 2016-10-28 DIAGNOSIS — R87619 Unspecified abnormal cytological findings in specimens from cervix uteri: Secondary | ICD-10-CM | POA: Insufficient documentation

## 2016-10-28 NOTE — Progress Notes (Signed)
[employer] interpreter Rosemarie Ax [phone]

## 2016-10-28 NOTE — Progress Notes (Signed)
Subjective:  Anna Velez is a 46 y.o. [phone] at [redacted]w[redacted]d being seen today for ongoing prenatal care.  She is currently monitored for the following issues for this high-risk pregnancy and has Supervision of high risk pregnancy, antepartum; Advanced maternal age in multigravida, second trimester; Gestational diabetes (early diagnosis); Language barrier; Polyhydramnios affecting pregnancy; and Abnormal Pap smear of cervix on her problem list.  Patient reports no complaints.  Contractions: Not present. Vag. Bleeding: None.  Movement: Present. Denies leaking of fluid.   The following portions of the patient's history were reviewed and updated as appropriate: allergies, current medications, past family history, past medical history, past social history, past surgical history and problem list. Problem list updated.  Objective:   Vitals:   10/28/16 0959  BP: (!) 112/57  Pulse: 74  Weight: 184 lb 12.8 oz (83.8 kg)    Fetal Status: Fetal Heart Rate (bpm): 154   Movement: Present     General:  Alert, oriented and cooperative. Patient is in no acute distress.  Skin: Skin is warm and dry. No rash noted.   Cardiovascular: Normal heart rate noted  Respiratory: Normal respiratory effort, no problems with respiration noted  Abdomen: Soft, gravid, appropriate for gestational age. Pain/Pressure: Present     Pelvic:  Cervical exam deferred        Extremities: Normal range of motion.  Edema: None  Mental Status: Normal mood and affect. Normal behavior. Normal judgment and thought content.   Urinalysis:      Assessment and Plan:  Pregnancy: [id number] at [redacted]w[redacted]d  1. Supervision of high risk pregnancy, antepartum Stable  2. Polyhydramnios affecting pregnancy Followed with U/S  3. Language barrier Interrupter services used today  4. Diet controlled gestational diabetes mellitus (GDM) in third trimester Did not bring BS readings today Importance of bring readings to all OB appts reviewed with  pt She reports in goal range however  5. Advanced maternal age in multigravida, second trimester   6. Atypical squamous cells of undetermined significance on cytologic smear of cervix (ASC-US) Declined pap smear today Will complete next visit Pap 6/17 ASCUS HPV negative  Preterm labor symptoms and general obstetric precautions including but not limited to vaginal bleeding, contractions, leaking of fluid and fetal movement were reviewed in detail with the patient. Please refer to After Visit Summary for other counseling recommendations.  Return in about 2 weeks (around 11/11/2016) for OB visit.   Chancy Milroy, MD

## 2016-11-11 ENCOUNTER — Encounter: Payer: Medicaid Other | Admitting: Obstetrics and Gynecology

## 2017-04-04 ENCOUNTER — Encounter (HOSPITAL_COMMUNITY): Payer: Self-pay

## 2018-08-15 IMAGING — US US MFM OB LIMITED
1 series · 14 of 28 positions shown · non-contrast
Comparison: none

[Series 1: us mfm ob limited · 14 of 33 slices shown]
[im 2/33]
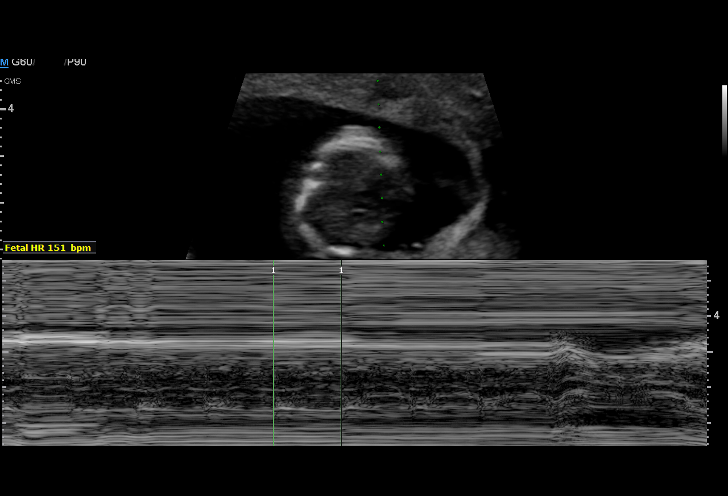
[im 4/33]
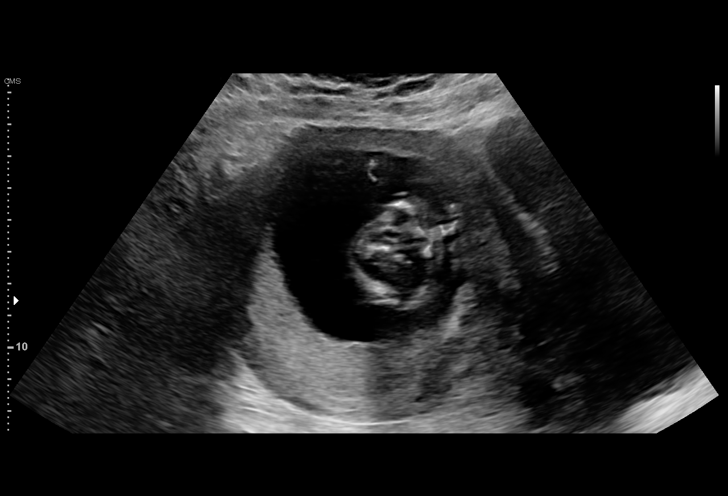
[im 6/33]
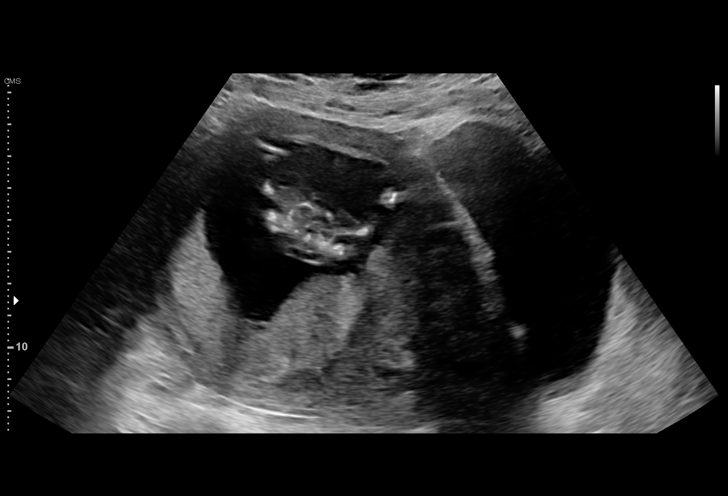
[im 9/33]
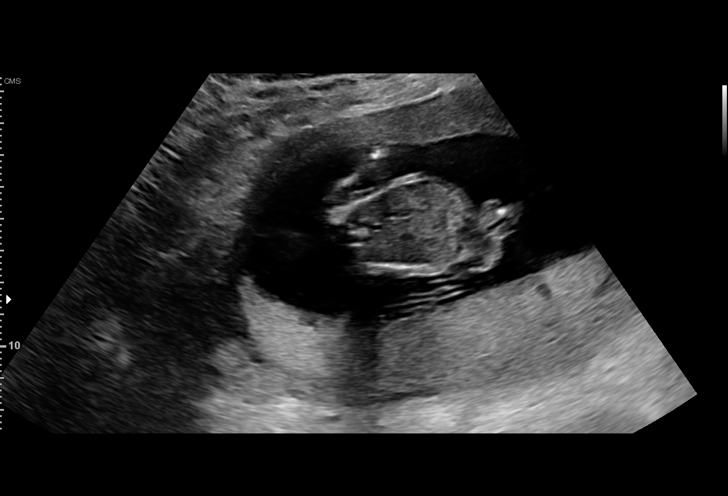
[im 11/33]
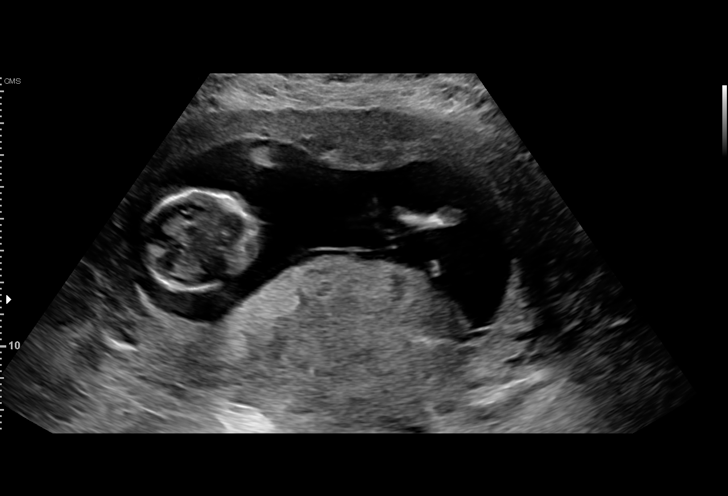
[im 14/33]
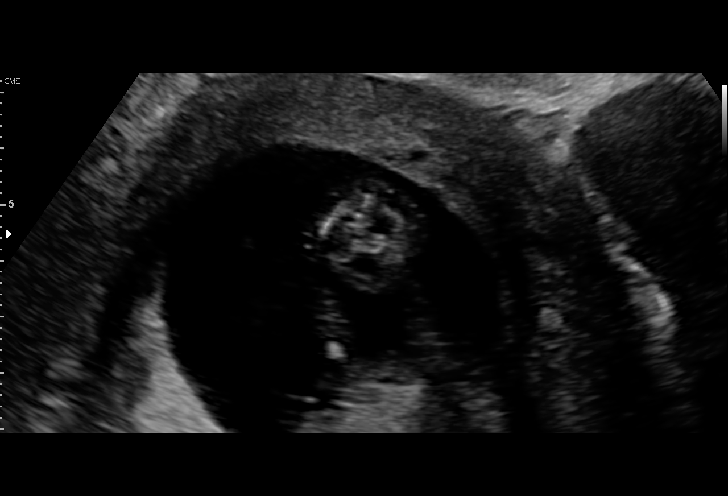
[im 16/33]
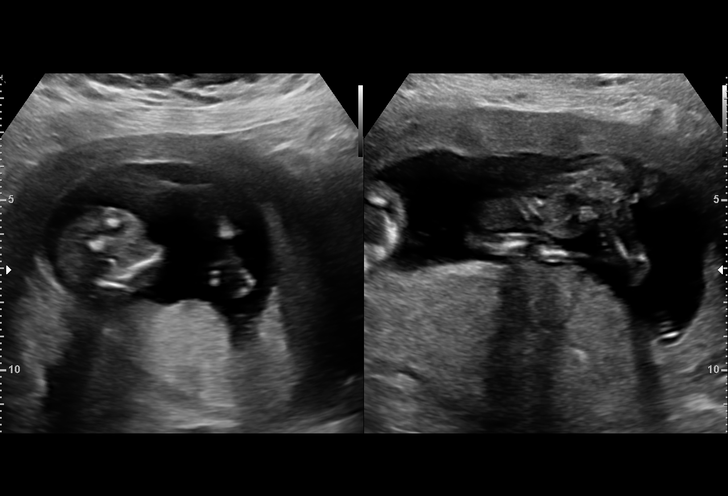
[im 18/33]
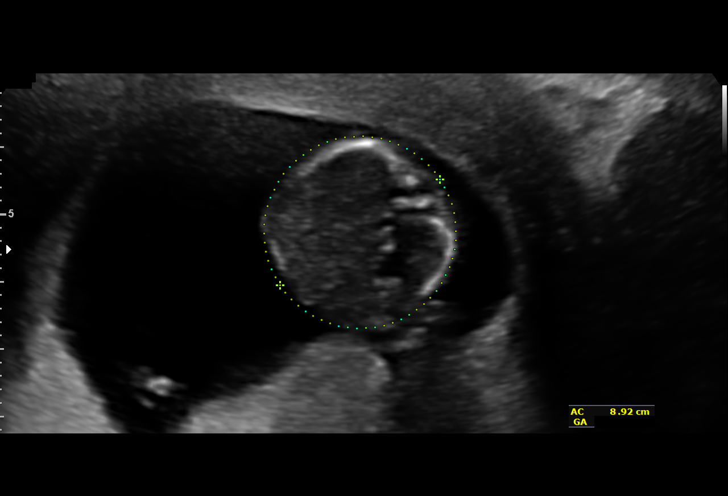
[im 21/33]
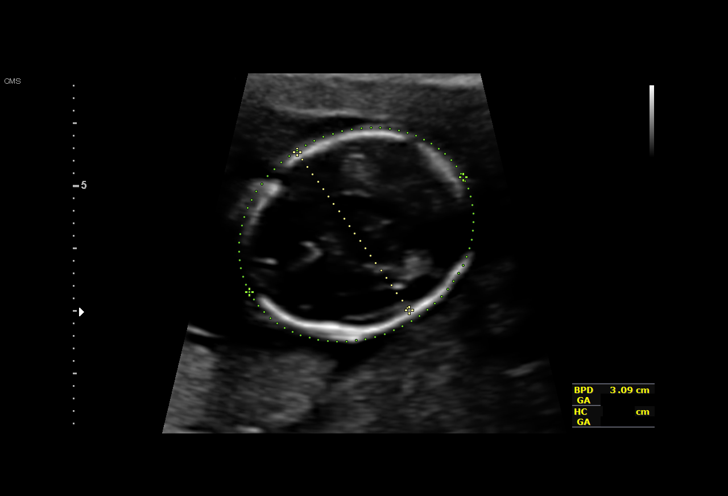
[im 23/33]
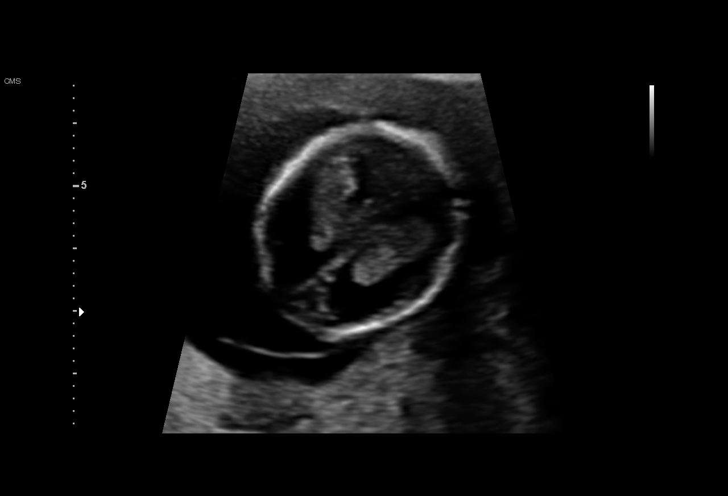
[im 25/33]
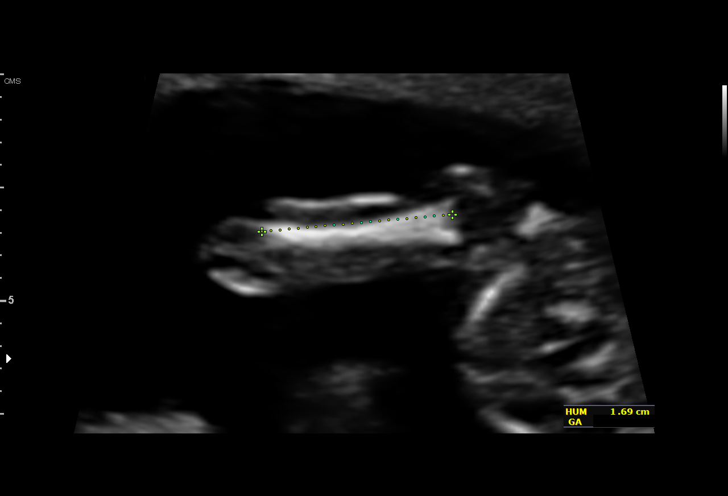
[im 28/33]
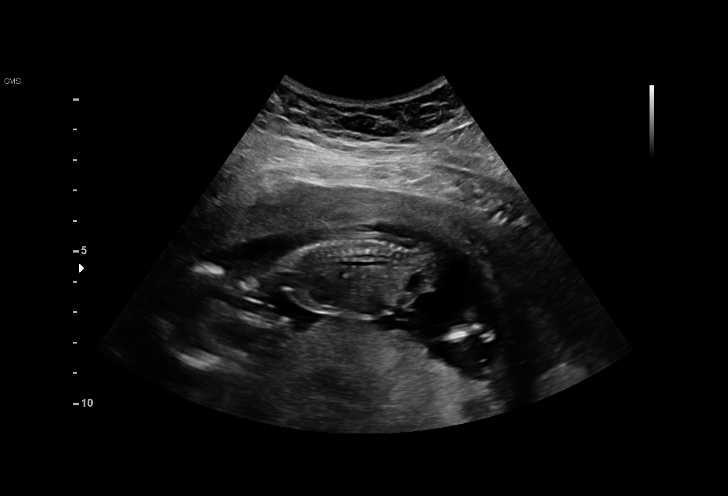
[im 30/33]
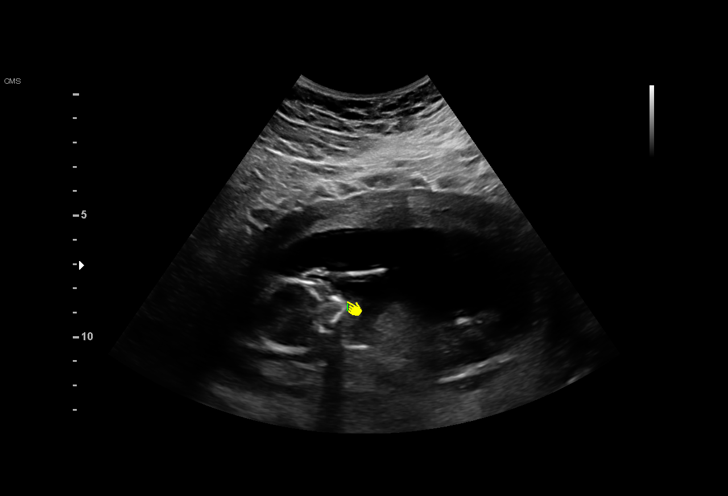
[im 33/33]
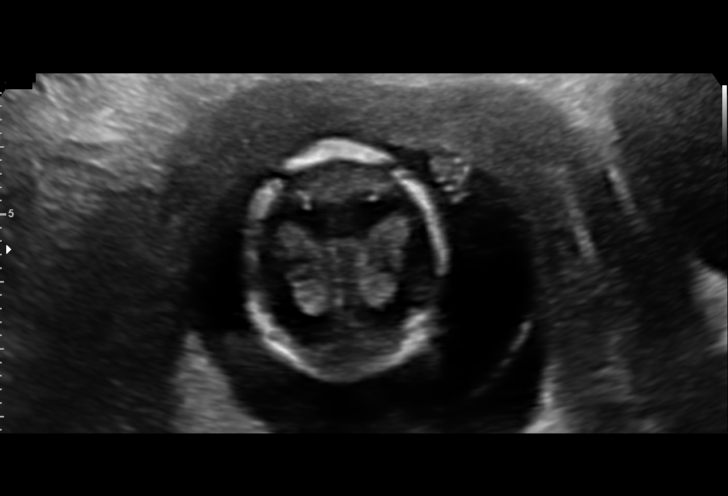

[14 of 28 positions shown; findings below may reference images not displayed]

[REDACTED]
[address],[HOSPITAL][HOSPITAL]

1  ALINO LE SULTAN ZOMBO            [id number]      [id number]     [id number]
Indications

15 weeks gestation of pregnancy
Encounter for uncertain dates
Advanced maternal age multigravida 35+,
first trimester
Gestational diabetes in pregnancy, diet
controlled
OB History

Gravidity:    6         Term:   3        Prem:   0        SAB:   0
TOP:          2       Ectopic:  0        Living: 3
Fetal Evaluation

Num Of Fetuses:     1
Fetal Heart         151
Rate(bpm):
Cardiac Activity:   Observed
Presentation:       Breech
Placenta:           Posterior

Amniotic Fluid
AFI FV:      Subjectively within normal limits

Largest Pocket(cm)
5.65
Biometry

BPD:      30.9  mm     G. Age:  15w 5d         59  %    CI:        75.58   %    70 - 86
FL/HC:      15.6   %    15.3 -
HC:      112.7  mm     G. Age:  15w 3d         36  %    HC/AC:      1.26        1.05 -
AC:       89.2  mm     G. Age:  15w 1d         43  %    FL/BPD:     57.0   %
FL:       17.6  mm     G. Age:  15w 1d         37  %    FL/AC:      19.7   %    20 - 24
HUM:        17  mm     G. Age:  14w 6d         36  %

Est. FW:     118  gm      0 lb 4 oz     69  %
Gestational Age

LMP:           13w 5d        Date:  04/17/16                 EDD:   01/22/17
U/S Today:     15w 3d                                        EDD:   01/10/17
Best:          15w 3d     Det. By:  U/S (07/22/16)           EDD:   01/10/17
Cervix Uterus Adnexa

Cervix
Closed

Uterus
No abnormality visualized.

Left Ovary
Not visualized.

Right Ovary
Not visualized.

Cul De Sac:   No free fluid seen.

Adnexa:       No abnormality visualized.
Impression

Singleton intrauterine pregnancy at 13+5 weeks here for first
trimester screening
Review of the anatomy shows no sonographic markers for
aneuploidy or structural anomalies
However, evaluations should be considered suboptimal
secondary to early EGA
Amniotic fluid volume is normal
biometry shows an EGA of 15+3 weeks, too late for first
trimester screening
Estimated fetal weight is 118g which is growth in the 69th
percentile
Recommendations

Recommend using an EDC of 01/10/2017
After genetic counseling she will have NIPT drawn today
Recommend a repeat scan for full anatomic survey in 4-6
weeks

## 2018-11-14 IMAGING — US US MFM OB FOLLOW-UP
1 series · 13 of 28 positions shown · non-contrast
Comparison: none

[Series 1: us mfm ob follow-up · 13 of 46 slices shown]
[im 2/46]
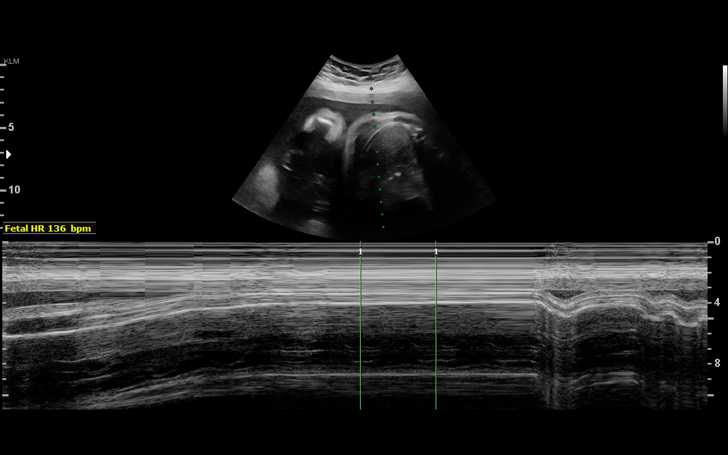
[im 6/46]
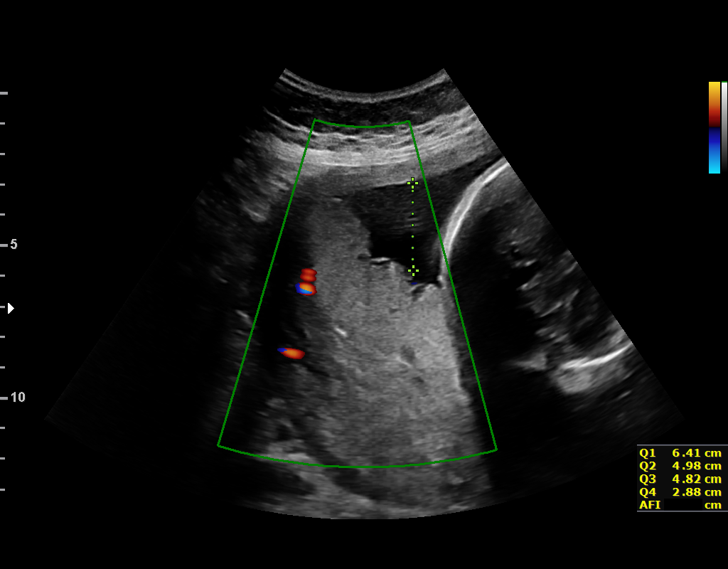
[im 9/46]
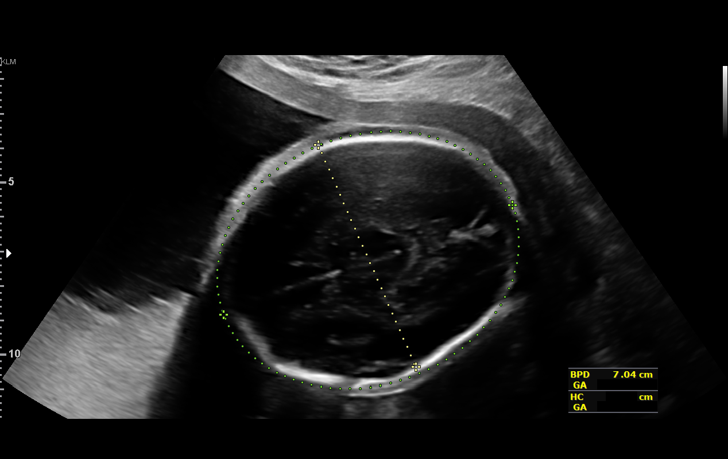
[im 12/46]
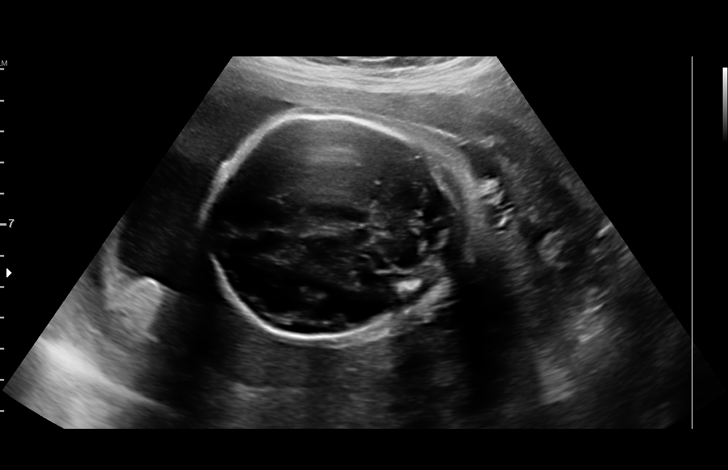
[im 16/46]
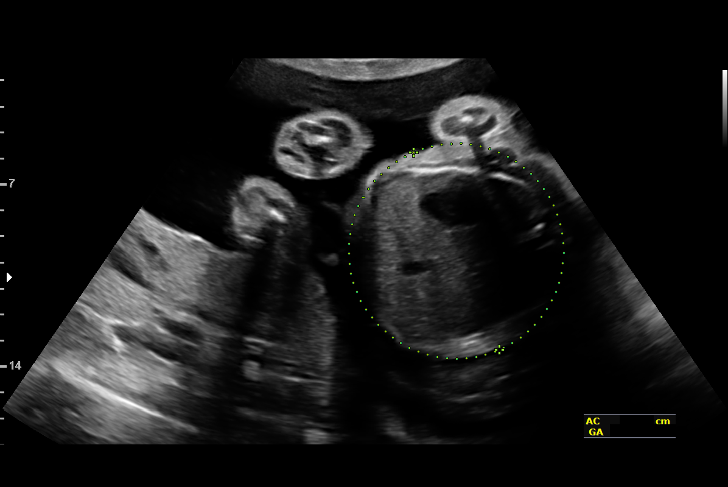
[im 19/46]
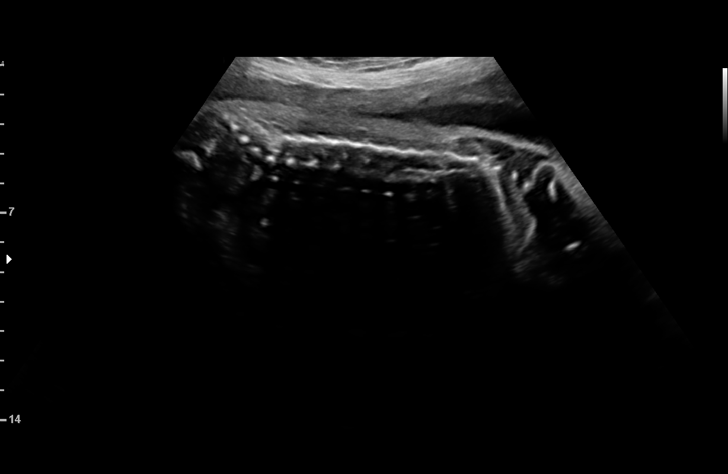
[im 24/46]
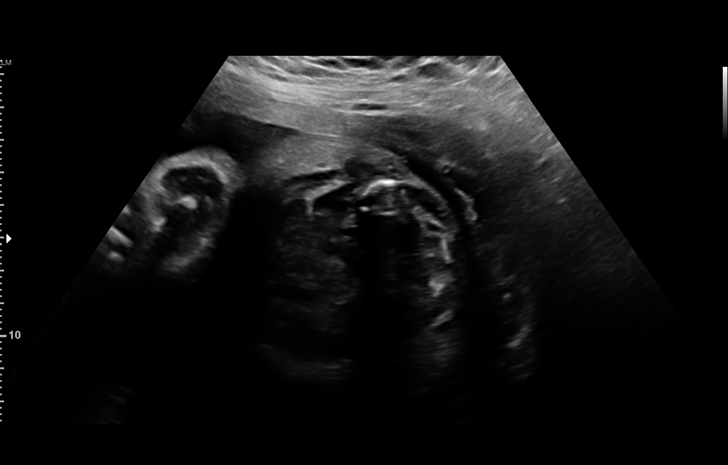
[im 27/46]
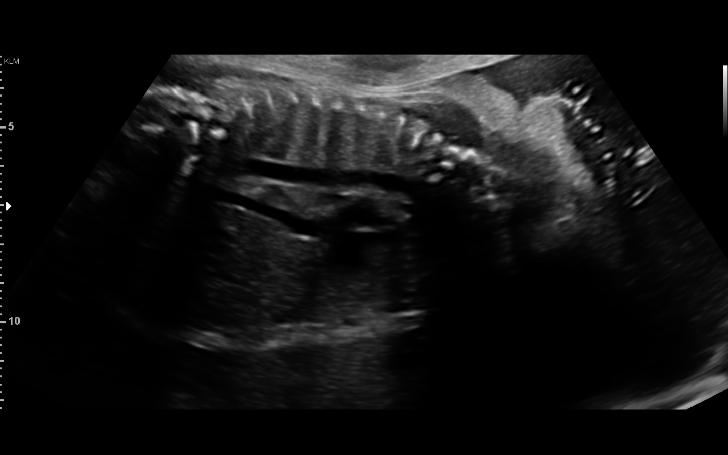
[im 31/46]
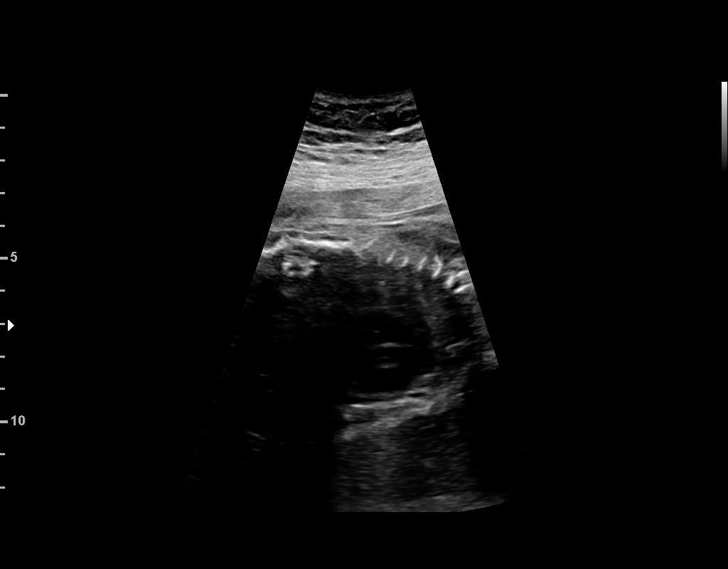
[im 34/46]
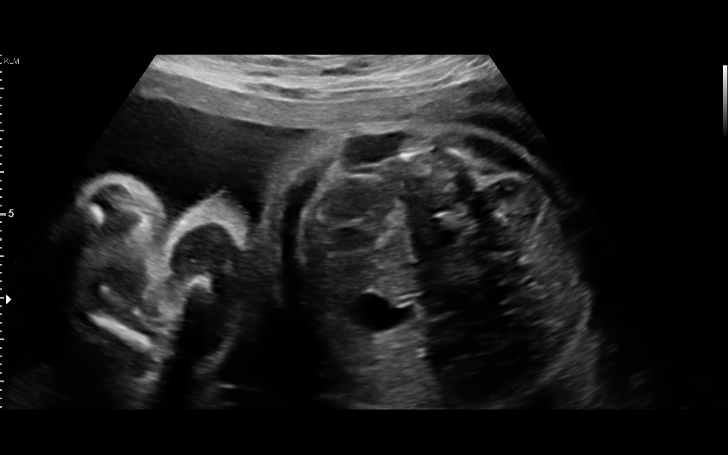
[im 37/46]
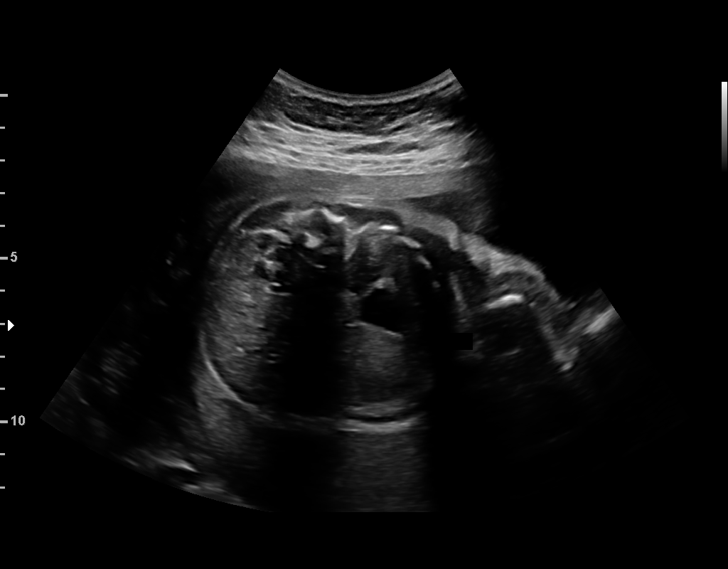
[im 41/46]
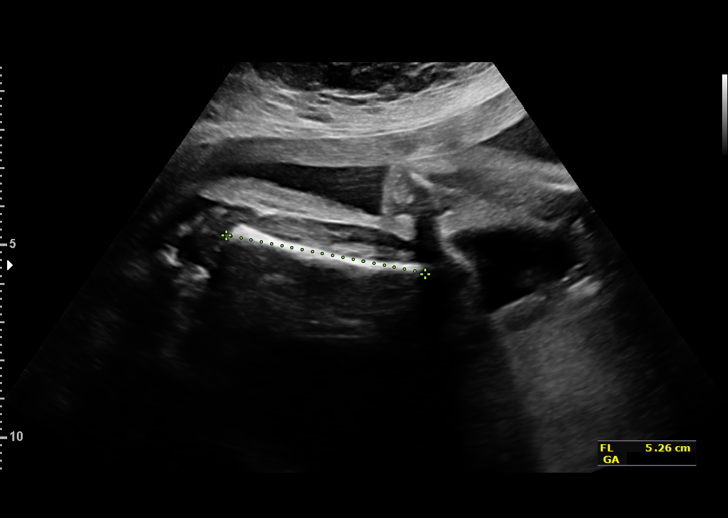
[im 44/46]
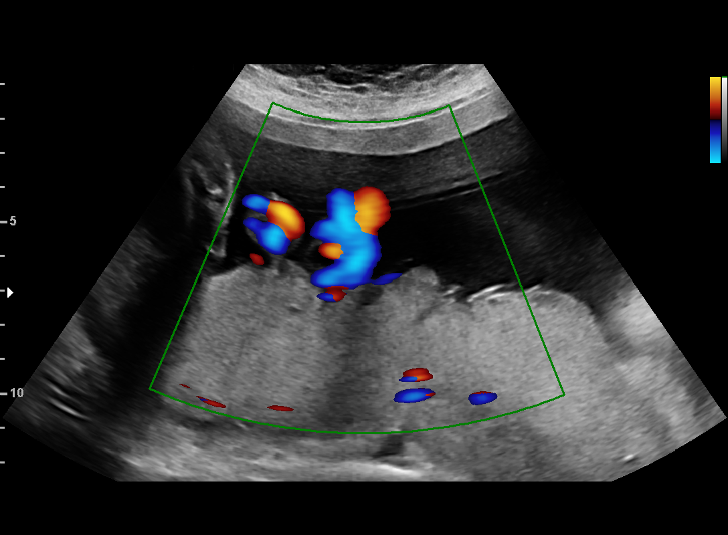

[13 of 28 positions shown; findings below may reference images not displayed]

[REDACTED] Health
[address],[HOSPITAL][HOSPITAL]

1  PAULK           [id number]      [id number]     [id number]
Indications

28 weeks gestation of pregnancy
Gestational diabetes in pregnancy, diet
controlled
Advanced maternal age multigravida 35+,
third trimester (normal NIPS)
Polyhydramnios, third trimester, antepartum
condition or complication, unspecified fetus
Antenatal follow-up for nonvisualized fetal
anatomy
OB History

Gravidity:    6         Term:   3        Prem:   0        SAB:   0
TOP:          2       Ectopic:  0        Living: 3
Fetal Evaluation

Num Of Fetuses:     1
Fetal Heart         136
Rate(bpm):
Cardiac Activity:   Observed
Presentation:       Variable
Placenta:           Posterior, above cervical os
P. Cord Insertion:  Visualized

Amniotic Fluid
AFI FV:      Subjectively within normal limits

AFI Sum(cm)     %Tile       Largest Pocket(cm)
19.09           74
RUQ(cm)       RLQ(cm)       LUQ(cm)        LLQ(cm)
6.41
Biometry

BPD:      70.4  mm     G. Age:  28w 2d         33  %    CI:        74.59   %   70 - 86
FL/HC:      20.6   %   18.8 -
HC:      258.7  mm     G. Age:  28w 1d         14  %    HC/AC:      0.95       1.05 -
AC:      273.3  mm     G. Age:  31w 3d       > 97  %    FL/BPD:     75.6   %   71 - 87
FL:       53.2  mm     G. Age:  28w 2d         30  %    FL/AC:      19.5   %   20 - 24

Est. FW:    0706  gm      3 lb 4 oz     77  %
Gestational Age

LMP:           26w 5d       Date:   04/17/16                 EDD:   01/22/17
U/S Today:     29w 0d                                        EDD:   01/06/17
Best:          28w 3d    Det. By:   U/S  (07/22/16)          EDD:   01/10/17
Anatomy

Cranium:               Appears normal         Aortic Arch:            Appears normal
Cavum:                 Appears normal         Ductal Arch:            Appears normal
Ventricles:            Appears normal         Diaphragm:              Previously seen
Choroid Plexus:        Previously seen        Stomach:                Appears normal, left
sided
Cerebellum:            Previously seen        Abdomen:                Appears normal
Posterior Fossa:       Previously seen        Abdominal Wall:         Previously seen
Nuchal Fold:           Not applicable (>20    Cord Vessels:           Previously seen
wks GA)
Face:                  Orbits and profile     Kidneys:                Appear normal
previously seen
Lips:                  Previously seen        Bladder:                Appears normal
Thoracic:              Appears normal         Spine:                  Appears normal
Heart:                 Previously seen        Upper Extremities:      Previously seen
RVOT:                  Previously seen        Lower Extremities:      Previously seen
LVOT:                  Previously seen

Other:  Female gender. Heels and right 5th digit previously visualized.
Technically difficult due to fetal position.
Cervix Uterus Adnexa

Cervix
Length:              4  cm.
Normal appearance by transabdominal scan.
Impression

Singleton intrauterine pregnancy at 28 weeks 3 days
gestation with fetal cardiac activity
Cephalic presentation
Posterior placenta without evidence of previa
Normal appearing fetal growth
AFI today > 19 cm
Normal appearing cervical length
Recommendations

Antenatal testing and serial growth as scheduled by primary
OB provider for diet controlled gestational diabetes and AMA
> 40 years of age

## 2023-03-30 ENCOUNTER — Other Ambulatory Visit (HOSPITAL_BASED_OUTPATIENT_CLINIC_OR_DEPARTMENT_OTHER): Payer: Self-pay | Admitting: Student

## 2023-03-30 DIAGNOSIS — I83813 Varicose veins of bilateral lower extremities with pain: Secondary | ICD-10-CM

## 2023-03-31 ENCOUNTER — Telehealth (HOSPITAL_BASED_OUTPATIENT_CLINIC_OR_DEPARTMENT_OTHER): Payer: Self-pay

## 2023-04-08 ENCOUNTER — Ambulatory Visit (HOSPITAL_BASED_OUTPATIENT_CLINIC_OR_DEPARTMENT_OTHER)
Admission: RE | Admit: 2023-04-08 | Discharge: 2023-04-08 | Disposition: A | Discharge status: Home / Self Care | Source: Ambulatory Visit | Attending: Student | Admitting: Student

## 2023-04-08 DIAGNOSIS — I83813 Varicose veins of bilateral lower extremities with pain: Secondary | ICD-10-CM | POA: Insufficient documentation

## 2023-06-13 ENCOUNTER — Other Ambulatory Visit: Payer: Self-pay | Admitting: Physician Assistant

## 2023-06-13 DIAGNOSIS — N644 Mastodynia: Secondary | ICD-10-CM
